# Patient Record
Sex: Male | Born: 1955 | Hispanic: No | Marital: Married | Smoking: Current some day smoker
Health system: Southern US, Community
[De-identification: ages and names within clinical notes are randomized; demographics above are authoritative.]

## PROBLEM LIST (undated history)

## (undated) HISTORY — PX: NEPHRECTOMY: SHX65

## (undated) HISTORY — PX: HERNIA REPAIR: SHX51

## (undated) HISTORY — PX: BACK SURGERY: SHX140

---

## 2014-12-12 ENCOUNTER — Emergency Department (HOSPITAL_BASED_OUTPATIENT_CLINIC_OR_DEPARTMENT_OTHER)
Admission: EM | Admit: 2014-12-12 | Discharge: 2014-12-13 | Disposition: A | Discharge status: Home / Self Care | Payer: Medicaid - Out of State | Attending: Emergency Medicine | Admitting: Emergency Medicine

## 2014-12-12 ENCOUNTER — Encounter (HOSPITAL_BASED_OUTPATIENT_CLINIC_OR_DEPARTMENT_OTHER): Payer: Self-pay | Admitting: *Deleted

## 2014-12-12 DIAGNOSIS — W228XXA Striking against or struck by other objects, initial encounter: Secondary | ICD-10-CM | POA: Diagnosis not present

## 2014-12-12 DIAGNOSIS — S61412A Laceration without foreign body of left hand, initial encounter: Secondary | ICD-10-CM | POA: Diagnosis present

## 2014-12-12 DIAGNOSIS — Y9289 Other specified places as the place of occurrence of the external cause: Secondary | ICD-10-CM | POA: Insufficient documentation

## 2014-12-12 DIAGNOSIS — Z720 Tobacco use: Secondary | ICD-10-CM | POA: Insufficient documentation

## 2014-12-12 DIAGNOSIS — Y998 Other external cause status: Secondary | ICD-10-CM | POA: Insufficient documentation

## 2014-12-12 DIAGNOSIS — Y9389 Activity, other specified: Secondary | ICD-10-CM | POA: Insufficient documentation

## 2014-12-12 DIAGNOSIS — Z23 Encounter for immunization: Secondary | ICD-10-CM | POA: Insufficient documentation

## 2014-12-12 MED ORDER — LIDOCAINE-EPINEPHRINE 2 %-1:100000 IJ SOLN
20.0000 mL | Freq: Once | INTRAMUSCULAR | Status: AC
  Administered 2014-12-13: 20 mL
  Filled 2014-12-12: qty 1

## 2014-12-12 MED ORDER — TETANUS-DIPHTH-ACELL PERTUSSIS 5-2.5-18.5 LF-MCG/0.5 IM SUSP
INTRAMUSCULAR | Status: AC
  Administered 2014-12-12: 0.5 mL via INTRAMUSCULAR
  Filled 2014-12-12: qty 0.5

## 2014-12-12 MED ORDER — TETANUS-DIPHTH-ACELL PERTUSSIS 5-2.5-18.5 LF-MCG/0.5 IM SUSP
0.5000 mL | Freq: Once | INTRAMUSCULAR | Status: AC
  Administered 2014-12-12: 0.5 mL via INTRAMUSCULAR

## 2014-12-12 NOTE — ED Notes (Signed)
Laceration to his left hand while taking his luggage out of a rental car. Bleeding controlled.

## 2014-12-12 NOTE — ED Provider Notes (Signed)
CSN: [id number]     Arrival date & time 12/12/14  2056 History   First MD Initiated Contact with Patient 12/12/14 2312     Chief Complaint  Patient presents with  . Extremity Laceration     (Consider location/radiation/quality/duration/timing/severity/associated sxs/prior Treatment) HPI Comments: Patient presents with complaint of laceration to the left hand over the second digit which occurred at approximately 5:30 PM. Patient was taking his luggage out of a car when he struck it on a piece of metal. He is uncertain exactly what he struck it on. Area was bleeding and he applied pressure and a bandage. Later in the evening patient noted the area was swollen and began to bleed again so he presents for evaluation. Last tetanus unknown. No other injuries reported. Patient has not taken any medications for this. No blood thinners.  The history is provided by the patient.    History reviewed. No pertinent past medical history. Past Surgical History  Procedure Laterality Date  . Back surgery    . Hernia repair    . Nephrectomy     No family history on file. History  Substance Use Topics  . Smoking status: Current Some Day Smoker  . Smokeless tobacco: Not on file  . Alcohol Use: No    Review of Systems  Constitutional: Negative for activity change.  Musculoskeletal: Positive for joint swelling and arthralgias. Negative for back pain, gait problem and neck pain.  Skin: Positive for wound.  Neurological: Negative for weakness and numbness.      Allergies  Review of patient's allergies indicates no known allergies.  Home Medications   Prior to Admission medications   Not on File   BP 130/76 mmHg  Pulse 86  Temp(Src) 98.2 F (36.8 C) (Oral)  Resp 18  Ht 5\' 11"  (1.803 m)  Wt 190 lb (86.183 kg)  BMI 26.51 kg/m2  SpO2 100%   Physical Exam  Constitutional: He appears well-developed and well-nourished.  HENT:  Head: Normocephalic and atraumatic.  Eyes: Conjunctivae are  normal.  Neck: Normal range of motion. Neck supple.  Cardiovascular: Normal pulses.   Pulmonary/Chest: No respiratory distress.  Musculoskeletal: He exhibits tenderness. He exhibits no edema.  Patients with 1 cm, mildly gaping, zigzag laceration overlying the dorsal aspect of the second MCP joint. Wound is approximately 5 mm in depth. No foreign bodies or tendon involvement seen. Wound base entirely visualized. Patient has full range of motion of hands and fingers including the 2nd MCP.  Neurological: He is alert. No sensory deficit.  Motor, sensation, and vascular distal to the injury is fully intact.   Skin: Skin is warm and dry.  Psychiatric: He has a normal mood and affect.  Nursing note and vitals reviewed.   ED Course  Procedures (including critical care time) Labs Review Labs Reviewed - No data to display  Imaging Review No results found.   EKG Interpretation None       11:57 PM Patient seen and examined. Tetanus booster given. We will suture wound.  Vital signs reviewed and are as follows: BP 130/76 mmHg  Pulse 86  Temp(Src) 98.2 F (36.8 C) (Oral)  Resp 18  Ht 5\' 11"  (1.803 m)  Wt 190 lb (86.183 kg)  BMI 26.51 kg/m2  SpO2 100%  LACERATION REPAIR Performed by: Carolee RotaGEIPLE,Koltin Wehmeyer S Authorized by: Carolee RotaGEIPLE,Marcayla Budge S Consent: Verbal consent obtained. Risks and benefits: risks, benefits and alternatives were discussed Consent given by: patient Patient identity confirmed: provided demographic data Prepped and Draped in normal sterile  fashion Wound explored  Laceration Location: L 2nd MCP, dorsal  Laceration Length: 1cm  No Foreign Bodies seen or palpated  Anesthesia: local infiltration  Local anesthetic: lidocaine 2% with epinephrine  Anesthetic total: 2 ml  Irrigation method: skin scrub, soak Amount of cleaning: standard  Skin closure: 4-0 Ethilon  Number of sutures: 2  Technique: simple interrupted  Patient tolerance: Patient tolerated the procedure  well with no immediate complications.   12:26 AM Patient counseled on wound care. Patient counseled on need to return or see PCP/urgent care for suture removal in 10 days. Patient was urged to return to the Emergency Department urgently with worsening pain, swelling, expanding erythema especially if it streaks away from the affected area, fever, or if they have any other concerns. Patient verbalized understanding.    MDM   Final diagnoses:  Laceration of left hand, initial encounter   Patient with laceration as above. Wound fully visualize and no foreign bodies suspected. Patient has 4 range of motion I do not suspect extensor tendon injury. Do not suspect "fight bite", tetanus updated. Wound closure without complication.    Renne Crigler, PA-C 12/13/14 1610  Mirian Mo, MD 12/13/14 [phone]

## 2014-12-13 NOTE — Discharge Instructions (Signed)
Please read and follow all provided instructions.  Your diagnoses today include:  1. Laceration of left hand, initial encounter    Tests performed today include:  Vital signs. See below for your results today.   Medications prescribed:   None  Take any prescribed medications only as directed.   Home care instructions:  Follow any educational materials and wound care instructions contained in this packet.   Keep affected area above the level of your heart when possible to minimize swelling. Wash area gently twice a day with warm soapy water. Do not apply alcohol or hydrogen peroxide. Cover the area if it draining or weeping.   Follow-up instructions: Suture Removal: Return to the Emergency Department or see your primary care care doctor in 10 days for a recheck of your wound and removal of your sutures or staples.    Return instructions:  Return to the Emergency Department if you have:  Fever  Worsening pain  Worsening swelling of the wound  Pus draining from the wound  Redness of the skin that moves away from the wound, especially if it streaks away from the affected area   Any other emergent concerns  Your vital signs today were: BP 130/76 mmHg   Pulse 86   Temp(Src) 98.2 F (36.8 C) (Oral)   Resp 18   Ht 5\' 11"  (1.803 m)   Wt 190 lb (86.183 kg)   BMI 26.51 kg/m2   SpO2 100% If your blood pressure (BP) was elevated above 135/85 this visit, please have this repeated by your doctor within one month. --------------

## 2015-01-16 ENCOUNTER — Encounter (HOSPITAL_BASED_OUTPATIENT_CLINIC_OR_DEPARTMENT_OTHER): Payer: Self-pay | Admitting: *Deleted

## 2015-01-16 ENCOUNTER — Emergency Department (HOSPITAL_BASED_OUTPATIENT_CLINIC_OR_DEPARTMENT_OTHER): Payer: Medicaid - Out of State

## 2015-01-16 ENCOUNTER — Emergency Department (HOSPITAL_BASED_OUTPATIENT_CLINIC_OR_DEPARTMENT_OTHER)
Admission: EM | Admit: 2015-01-16 | Discharge: 2015-01-17 | Disposition: A | Discharge status: Home / Self Care | Payer: Medicaid - Out of State | Attending: Emergency Medicine | Admitting: Emergency Medicine

## 2015-01-16 DIAGNOSIS — Z7982 Long term (current) use of aspirin: Secondary | ICD-10-CM | POA: Insufficient documentation

## 2015-01-16 DIAGNOSIS — Z79899 Other long term (current) drug therapy: Secondary | ICD-10-CM | POA: Diagnosis not present

## 2015-01-16 DIAGNOSIS — R911 Solitary pulmonary nodule: Secondary | ICD-10-CM | POA: Diagnosis not present

## 2015-01-16 DIAGNOSIS — R042 Hemoptysis: Secondary | ICD-10-CM | POA: Diagnosis present

## 2015-01-16 DIAGNOSIS — Z720 Tobacco use: Secondary | ICD-10-CM | POA: Diagnosis not present

## 2015-01-16 DIAGNOSIS — J439 Emphysema, unspecified: Secondary | ICD-10-CM | POA: Diagnosis not present

## 2015-01-16 MED ORDER — SODIUM CHLORIDE 0.9 % IV SOLN
INTRAVENOUS | Status: DC
Start: 2015-01-17 — End: 2015-01-17
  Administered 2015-01-17: 125 mL/h via INTRAVENOUS

## 2015-01-16 NOTE — ED Notes (Signed)
Alert, NAD, calm, interactive, resps e/u, no dyspnea noted. Denies: pain, sob, nv, cough, congestion, cold sx. Reports "noted blood once when he forced a cough to see if anything came out". Reports "noted a short episode of sharp pain in L lower anterior chest just a few minutes ago when I got up to the b/r, not before or after", recent flights to [place] and [place].

## 2015-01-16 NOTE — ED Notes (Signed)
Pt reports single episode of coughing up blood around 6pm- denies other illness or pain- reports frequent recent air travel

## 2015-01-16 NOTE — ED Provider Notes (Signed)
CSN: [id number]     Arrival date & time 01/16/15  1945 History  This chart was scribed for Jim Libra, MD by Tonye Royalty, ED Scribe. This patient was seen in room MH09/MH09 and the patient's care was started at 11:40 PM.    Chief Complaint  Patient presents with  . Hemoptysis   The history is provided by the patient. No language interpreter was used.    HPI Comments: Jim Morales is a 59 y.o. male who presents to the Emergency Department complaining of a single episode of hemoptysis at 1800 today. He states it was a significant amount of blood mixed with mucous. He states he then tried to cough more and produced only a little blood. Then he had a shower and coughed again and it was only somewhat pink. 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 Last flight was 4 days ago. He notes an unusual sensation in his calves and intermittent, small, sharp pain in left lower lateral chest. He states he uses [facility] every day. He denies chest pain, leg swelling or SOB.   History reviewed. No pertinent past medical history. Past Surgical History  Procedure Laterality Date  . Back surgery    . Hernia repair    . Nephrectomy     No family history on file. History  Substance Use Topics  . Smoking status: Current Some Day Smoker    Types: Cigarettes  . Smokeless tobacco: Not on file  . Alcohol Use: No    Review of Systems A complete 10 system review of systems was obtained and all systems are negative except as noted in the HPI and PMH.    Allergies  Review of patient's allergies indicates no known allergies.  Home Medications   Prior to Admission medications   Medication Sig Start Date End Date Taking? Authorizing Provider  aspirin 81 MG tablet Take 81 mg by mouth daily.   Yes Historical Provider, MD  docusate sodium (COLACE) 100 MG capsule Take 100 mg by mouth daily.   Yes Historical Provider, MD  temazepam (RESTORIL) 30 MG capsule Take 30 mg by mouth  at bedtime as needed for sleep.   Yes Historical Provider, MD   BP 130/89 mmHg  Pulse 63  Temp(Src) 98.3 F (36.8 C) (Oral)  Resp 12  Ht  (1.803 m)  Wt 191 lb (86.637 kg)  BMI 26.65 kg/m2  SpO2 100% Physical Exam  Nursing note and vitals reviewed.  General: Well-developed, well-nourished male in no acute distress; appearance consistent with age of record HENT: normocephalic; atraumatic Eyes: pupils equal, round and reactive to light; extraocular muscles intact Neck: supple Heart: regular rate and rhythm; no murmurs, rubs or gallops Lungs: clear to auscultation bilaterally Abdomen: soft; nondistended; nontender; no masses or hepatosplenomegaly; bowel sounds present Extremities: No deformity; full range of motion; pulses normal; no calf tenderness; no palpable cords; no edema Neurologic: Awake, alert and oriented; motor function intact in all extremities and symmetric; no facial droop Skin: Warm and dry Psychiatric: Normal mood and affect   ED Course  Procedures (including critical care time)  DIAGNOSTIC STUDIES: Oxygen Saturation is 99% on room air, normal by my interpretation.    COORDINATION OF CARE: 11:44 PM Discussed treatment plan with patient at beside, including workup for blood clots given his history of recent flights. The patient agrees with the plan and has no further questions at this time.   MDM   Nursing  notes and vitals signs, including pulse oximetry, reviewed.  Summary of this visit's results, reviewed by myself:  Labs:  Results for orders placed or performed during the hospital encounter of 01/16/15 (from the past 24 hour(s))  Basic metabolic panel     Status: Abnormal   Collection Time: 01/17/15 12:01 AM  Result Value Ref Range   Sodium 136 135 - 145 mmol/L   Potassium 3.8 3.5 - 5.1 mmol/L   Chloride 110 96 - 112 mmol/L   CO2 24 19 - 32 mmol/L   Glucose, Bld 88 70 - 99 mg/dL   BUN 13 6 - 23 mg/dL   Creatinine, Ser 7.82 0.50 - 1.35 mg/dL    Calcium 8.2 (L) 8.4 - 10.5 mg/dL   GFR calc non Af Amer 57 (L) >90 mL/min   GFR calc Af Amer 66 (L) >90 mL/min   Anion gap 2 (L) 5 - 15  CBC with Differential/Platelet     Status: None   Collection Time: 01/17/15 12:01 AM  Result Value Ref Range   WBC 6.6 4.0 - 10.5 K/uL   RBC 4.65 4.22 - 5.81 MIL/uL   Hemoglobin 13.7 13.0 - 17.0 g/dL   HCT 95.6 21.3 - 08.6 %   MCV 88.2 78.0 - 100.0 fL   MCH 29.5 26.0 - 34.0 pg   MCHC 33.4 30.0 - 36.0 g/dL   RDW 57.8 46.9 - 62.9 %   Platelets 216 150 - 400 K/uL   Neutrophils Relative % 47 43 - 77 %   Neutro Abs 3.1 1.7 - 7.7 K/uL   Lymphocytes Relative 38 12 - 46 %   Lymphs Abs 2.5 0.7 - 4.0 K/uL   Monocytes Relative 11 3 - 12 %   Monocytes Absolute 0.7 0.1 - 1.0 K/uL   Eosinophils Relative 4 0 - 5 %   Eosinophils Absolute 0.3 0.0 - 0.7 K/uL   Basophils Relative 0 0 - 1 %   Basophils Absolute 0.0 0.0 - 0.1 K/uL    Imaging Studies: Dg Chest 2 View  01/16/2015   CLINICAL DATA:  Two episodes of hemoptysis today.  EXAM: CHEST  2 VIEW  COMPARISON:  None.  FINDINGS: The cardiomediastinal contours are normal. Mild bronchial thickening and coarsened interstitial markings. Pulmonary vasculature is normal. No consolidation, pleural effusion, or pneumothorax. No acute osseous abnormalities are seen.  IMPRESSION: Mild bronchial thickening, can be seen with bronchitis or asthma.   Electronically Signed   By: Rubye Oaks M.D.   On: 01/16/2015 21:09   Ct Angio Chest Pe W/cm &/or Wo Cm  01/17/2015   CLINICAL DATA:  Single episode of hemoptysis at 18:00 today. History of nephrectomy.  EXAM: CT ANGIOGRAPHY CHEST WITH CONTRAST  TECHNIQUE: Multidetector CT imaging of the chest was performed using the standard protocol during bolus administration of intravenous contrast. Multiplanar CT image reconstructions and MIPs were obtained to evaluate the vascular anatomy.  CONTRAST:  80mL OMNIPAQUE IOHEXOL 350 MG/ML SOLN  COMPARISON:  Chest radiograph earlier this day.   FINDINGS: There are no filling defects within the pulmonary arteries to suggest pulmonary embolus.  The thoracic aorta is normal in caliber, tortuous in its course distally. Minimal atherosclerosis of the aortic arch. The heart size is normal. Coronary artery calcifications are seen. No pleural or pericardial effusion. No mediastinal or hilar adenopathy.  Mild moderate emphysema. There is central bronchial thickening. No consolidation. Subpleural scarring in the anterior right middle lobe. Small punctate hyperdensities may reflect postsurgical change or calcified granuloma. There  is a 3 mm subpleural nodule in the anterior left upper lobe, image 52/103. No dominant pulmonary mass. No endobronchial lesion.  Evaluation of the upper abdomen demonstrates surgical clips in the right renal fossa. There is a 1.6 cm cyst from the upper left kidney, partially included.  Review of the MIP images confirms the above findings.  IMPRESSION: 1. No pulmonary embolus. 2. Moderate emphysema. Subpleural 3 mm nodule in the anterior left upper lobe. If the patient is at high risk for bronchogenic carcinoma, follow-up chest CT at 1 year is recommended. If the patient is at low risk, no follow-up is needed. This recommendation follows the consensus statement: Guidelines for Management of Small Pulmonary Nodules Detected on CT Scans: A Statement from the [employer] Society as published in Radiology 2005; 237:395-400.   Electronically Signed   By: Rubye OaksMelanie  Ehinger M.D.   On: 01/17/2015 01:25   1:47 AM Patient advised to CT findings. He was advised to stop smoking. He will follow-up with his PCP in [place]. Will give him a copy of his CT scan so that his PCP can evaluate it.  I personally performed the services described in this documentation, which was scribed in my presence. The recorded information has been reviewed and is accurate.    Jim LibraJohn Tiane Szydlowski, MD 01/17/15 [phone]

## 2015-01-17 LAB — BASIC METABOLIC PANEL
Anion gap: 2 — ABNORMAL LOW (ref 5–15)
BUN: 13 mg/dL (ref 6–23)
CALCIUM: 8.2 mg/dL — AB (ref 8.4–10.5)
CHLORIDE: 110 mmol/L (ref 96–112)
CO2: 24 mmol/L (ref 19–32)
Creatinine, Ser: 1.34 mg/dL (ref 0.50–1.35)
GFR calc non Af Amer: 57 mL/min — ABNORMAL LOW (ref >90)
GFR, EST AFRICAN AMERICAN: 66 mL/min — AB (ref >90)
Glucose, Bld: 88 mg/dL (ref 70–99)
Potassium: 3.8 mmol/L (ref 3.5–5.1)
Sodium: 136 mmol/L (ref 135–145)

## 2015-01-17 LAB — CBC WITH DIFFERENTIAL/PLATELET
BASOS ABS: 0 10*3/uL (ref 0.0–0.1)
Basophils Relative: 0 % (ref 0–1)
Eosinophils Absolute: 0.3 10*3/uL (ref 0.0–0.7)
Eosinophils Relative: 4 % (ref 0–5)
HCT: 41 % (ref 39.0–52.0)
Hemoglobin: 13.7 g/dL (ref 13.0–17.0)
LYMPHS PCT: 38 % (ref 12–46)
Lymphs Abs: 2.5 10*3/uL (ref 0.7–4.0)
MCH: 29.5 pg (ref 26.0–34.0)
MCHC: 33.4 g/dL (ref 30.0–36.0)
MCV: 88.2 fL (ref 78.0–100.0)
Monocytes Absolute: 0.7 10*3/uL (ref 0.1–1.0)
Monocytes Relative: 11 % (ref 3–12)
NEUTROS ABS: 3.1 10*3/uL (ref 1.7–7.7)
Neutrophils Relative %: 47 % (ref 43–77)
PLATELETS: 216 10*3/uL (ref 150–400)
RBC: 4.65 MIL/uL (ref 4.22–5.81)
RDW: 13.1 % (ref 11.5–15.5)
WBC: 6.6 10*3/uL (ref 4.0–10.5)

## 2015-01-17 MED ORDER — SODIUM CHLORIDE 0.9 % IV BOLUS (SEPSIS)
1000.0000 mL | Freq: Once | INTRAVENOUS | Status: AC
  Administered 2015-01-17: 1000 mL via INTRAVENOUS

## 2015-01-17 MED ORDER — IOHEXOL 350 MG/ML SOLN
100.0000 mL | Freq: Once | INTRAVENOUS | Status: AC | PRN
  Administered 2015-01-17: 80 mL via INTRAVENOUS

## 2015-01-17 NOTE — ED Notes (Signed)
Alert, NAD, calm, no dyspnea noted.

## 2015-01-17 NOTE — ED Notes (Signed)
Dr. Molpus at [facility] 

## 2016-01-22 IMAGING — CT CT ANGIO CHEST
2 of 6 series · 18 of 36 positions shown · IV contrast (APPLIED)
Comparison: Chest radiograph earlier this day.

CLINICAL DATA: Single episode of hemoptysis at [DATE] today. History
of nephrectomy.

EXAM:
CT ANGIOGRAPHY CHEST WITH CONTRAST
TECHNIQUE: Multidetector CT imaging of the chest was performed using the
standard protocol during bolus administration of intravenous
contrast. Multiplanar CT image reconstructions and MIPs were
obtained to evaluate the vascular anatomy.
CONTRAST:  80mL OMNIPAQUE IOHEXOL 350 MG/ML SOLN

[Series 5: pe 1.0 b26f · axial · 0.77mm/px · z∈[+24,+301]mm · 17 of 309 slices shown]
[im 16/309  lung]
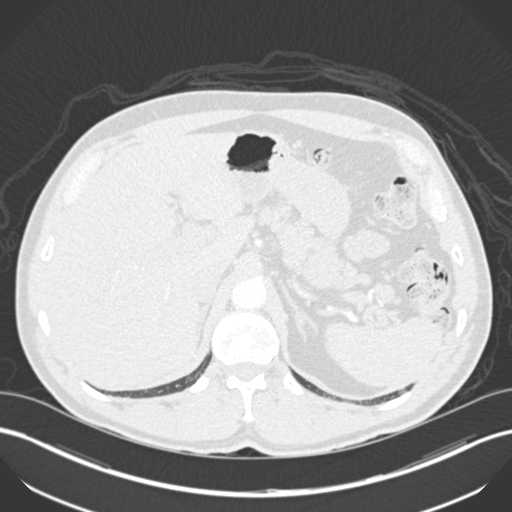
[im 31/309  mediastinal]
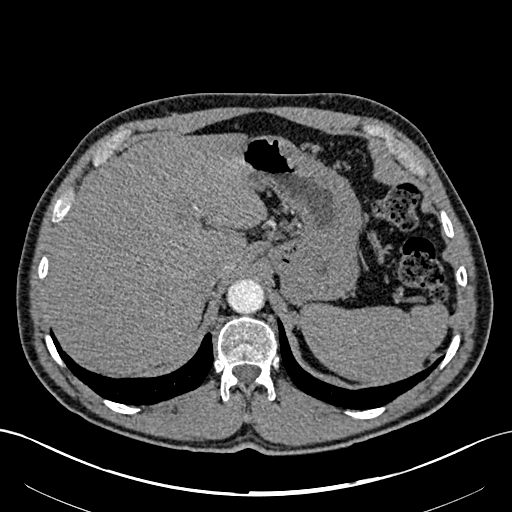
[im 47/309  lung]
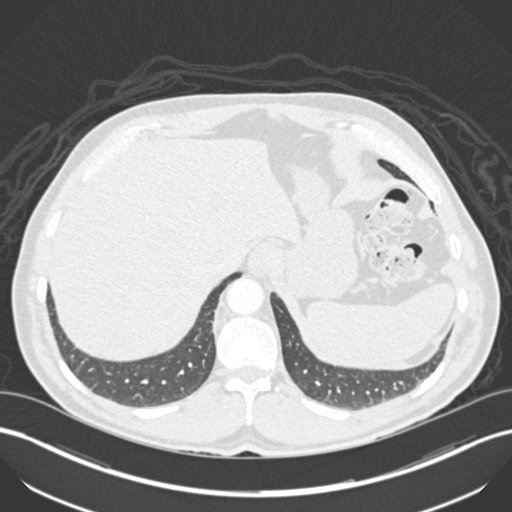
[im 62/309  mediastinal]
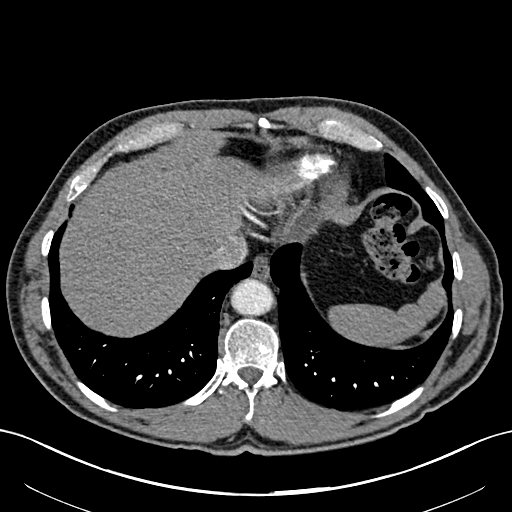
[im 93/309  lung]
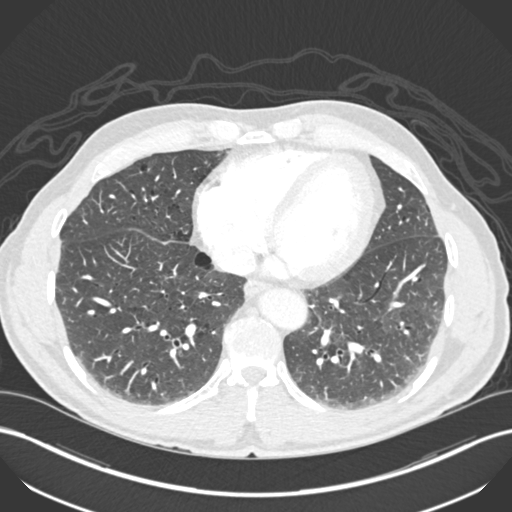
[im 108/309  mediastinal]
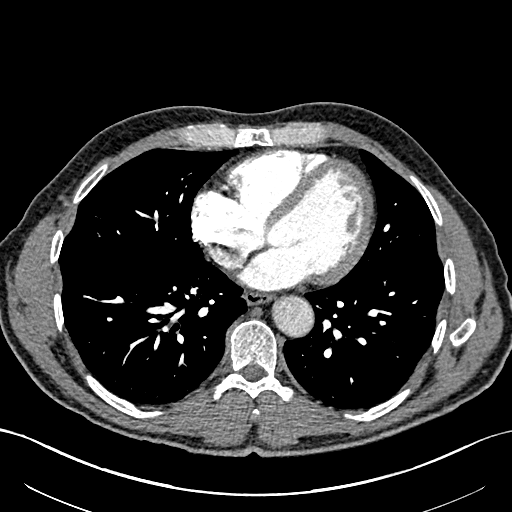
[im 124/309  lung]
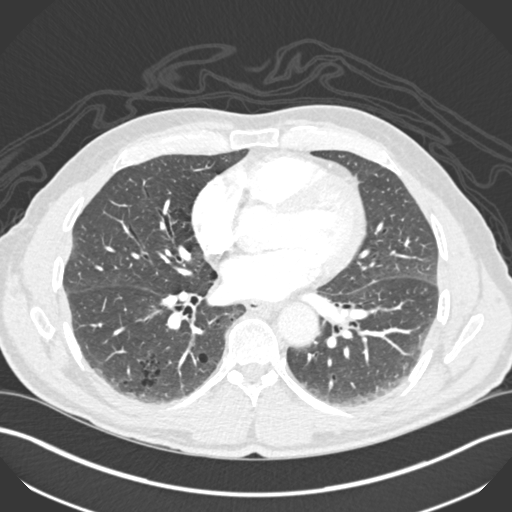
[im 139/309  mediastinal]
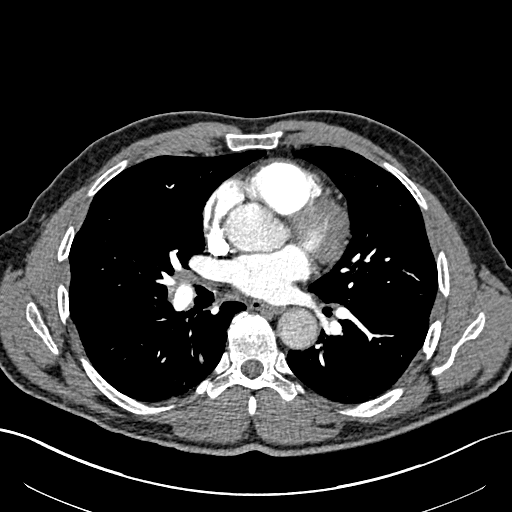
[im 155/309  lung]
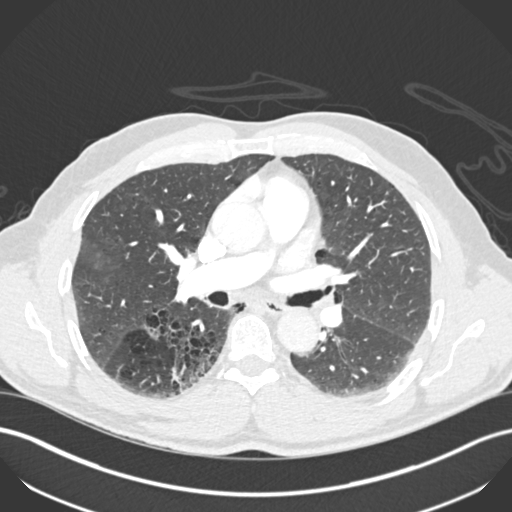
[im 170/309  mediastinal]
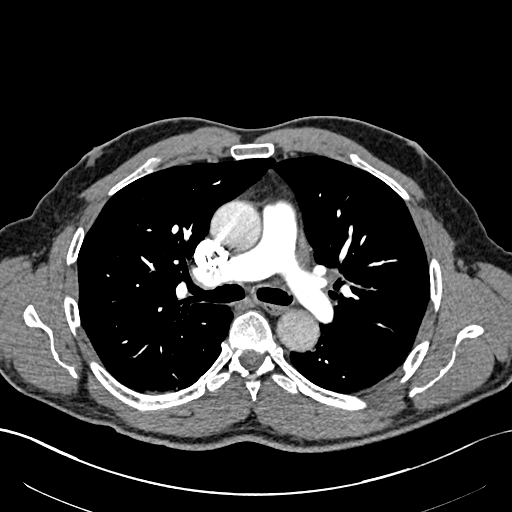
[im 185/309  lung]
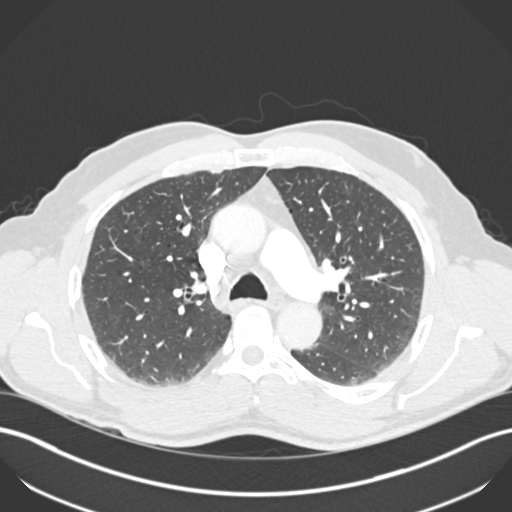
[im 201/309  mediastinal]
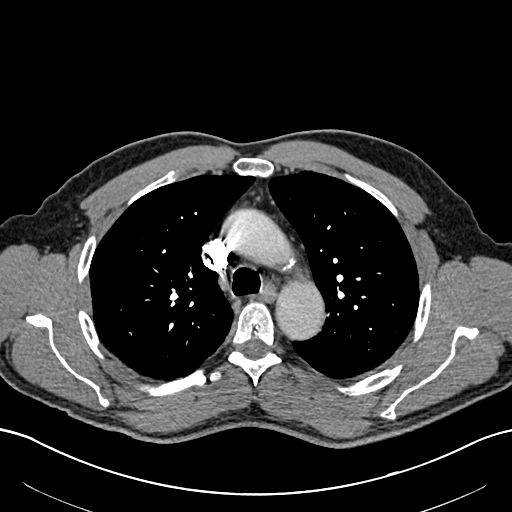
[im 216/309  lung]
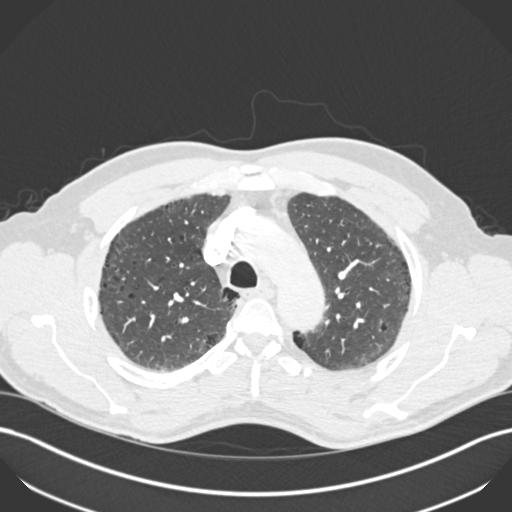
[im 247/309  mediastinal]
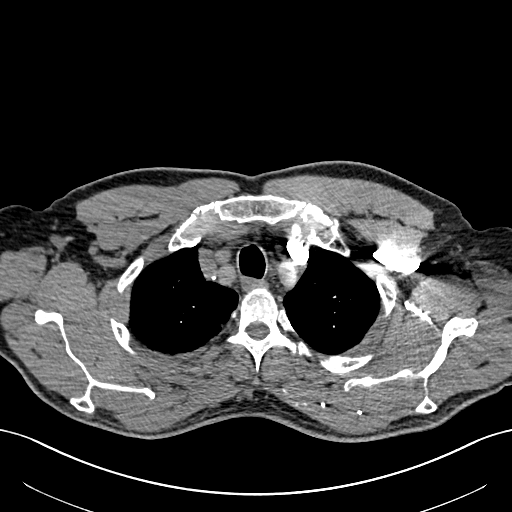
[im 262/309  lung]
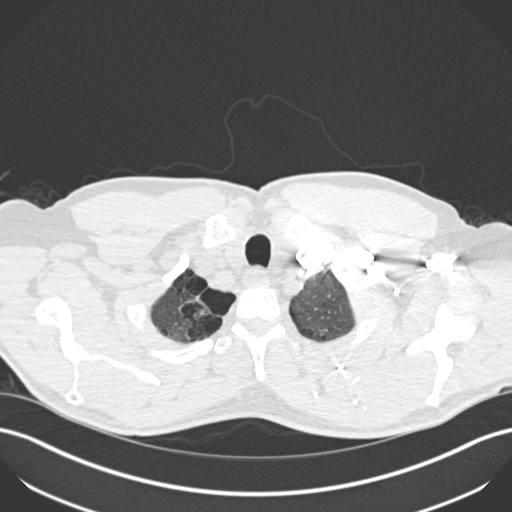
[im 278/309  mediastinal]
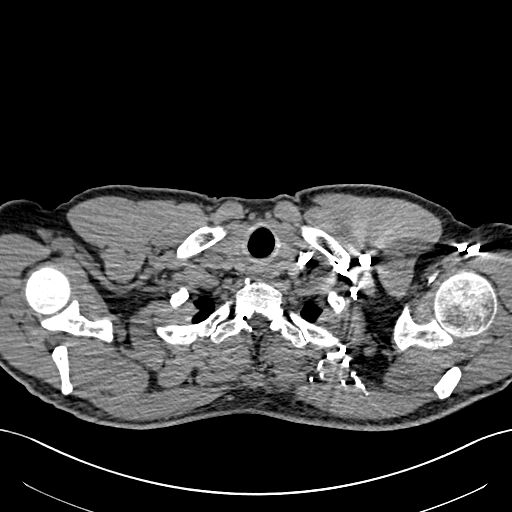
[im 293/309  lung]
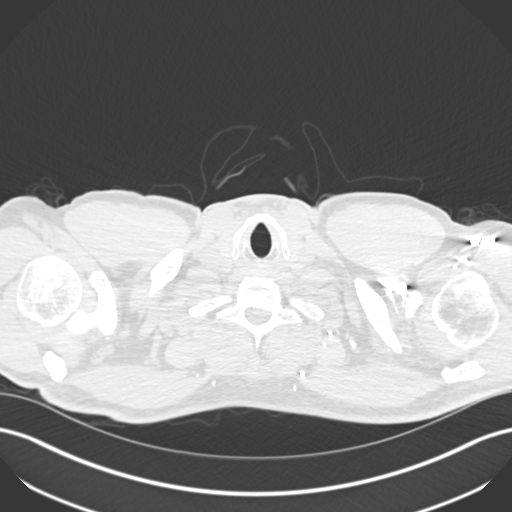

[Series 8: pe 2.0 coronal · coronal · 0.63mm/px · 1 of 130 slices shown]
[im 65/130  mediastinal]
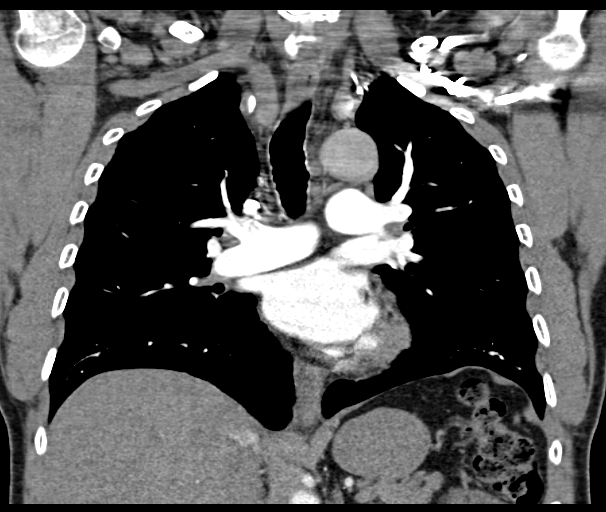

[18 of 36 positions shown; findings below may reference images not displayed]

FINDINGS: There are no filling defects within the pulmonary arteries to
suggest pulmonary embolus.

The thoracic aorta is normal in caliber, tortuous in its course
distally. Minimal atherosclerosis of the aortic arch. The heart size
is normal. Coronary artery calcifications are seen. No pleural or
pericardial effusion. No mediastinal or hilar adenopathy.

Mild moderate emphysema. There is central bronchial thickening. No
consolidation. Subpleural scarring in the anterior right middle
lobe. Small punctate hyperdensities may reflect postsurgical change
or calcified granuloma. There is a 3 mm subpleural nodule in the
anterior left upper lobe, image 52/103. No dominant pulmonary mass.
No endobronchial lesion.

Evaluation of the upper abdomen demonstrates surgical clips in the
right renal fossa. There is a 1.6 cm cyst from the upper left
kidney, partially included.

Review of the MIP images confirms the above findings.
IMPRESSION: 1. No pulmonary embolus.
2. Moderate emphysema. Subpleural 3 mm nodule in the anterior left
upper lobe. If the patient is at high risk for bronchogenic
carcinoma, follow-up chest CT at 1 year is recommended. If the
patient is at low risk, no follow-up is needed. This recommendation
follows the consensus statement: Guidelines for Management of Small
Pulmonary Nodules Detected on CT Scans: A Statement from the
